# Patient Record
Sex: Male | Born: 1965 | Race: White | Hispanic: No | Smoking: Never smoker
Health system: Southern US, Community
[De-identification: ages and names within clinical notes are randomized; demographics above are authoritative.]

---

## 1998-04-06 ENCOUNTER — Emergency Department (HOSPITAL_COMMUNITY): Admission: EM | Admit: 1998-04-06 | Discharge: 1998-04-06 | Payer: Self-pay | Admitting: Emergency Medicine

## 1998-10-25 ENCOUNTER — Emergency Department (HOSPITAL_COMMUNITY): Admission: EM | Admit: 1998-10-25 | Discharge: 1998-10-25 | Payer: Self-pay

## 2011-01-08 ENCOUNTER — Ambulatory Visit
Admission: RE | Admit: 2011-01-08 | Discharge: 2011-01-08 | Disposition: A | Discharge status: Home / Self Care | Payer: BC Managed Care – PPO | Source: Ambulatory Visit | Attending: Family Medicine | Admitting: Family Medicine

## 2011-01-08 ENCOUNTER — Other Ambulatory Visit: Payer: Self-pay | Admitting: Family Medicine

## 2011-01-08 DIAGNOSIS — R1031 Right lower quadrant pain: Secondary | ICD-10-CM

## 2011-01-08 MED ORDER — IOHEXOL 300 MG/ML  SOLN: 125.0000 mL | Freq: Once | INTRAMUSCULAR | Status: AC | PRN

## 2011-11-05 IMAGING — CT CT ABD-PELV W/ CM
3 of 5 series · 13 of 36 positions shown, 19 images · IV contrast (OMNI 300, WATER & [ID] OMNI 300)
Comparison: None

CLINICAL DATA: Right lower quadrant abdominal pain.

CT ABDOMEN AND PELVIS WITH CONTRAST
TECHNIQUE: Multidetector CT imaging of the abdomen and pelvis was
performed following the standard protocol during bolus
administration of intravenous contrast.
Contrast: 125 ml Hmnipaque-KSS.

[Series 3: routine abdomen · axial · 0.72mm/px · z∈[-371,-46]mm · 7 of 84 slices shown, 12 images]
[im 11/84  soft-tissue]
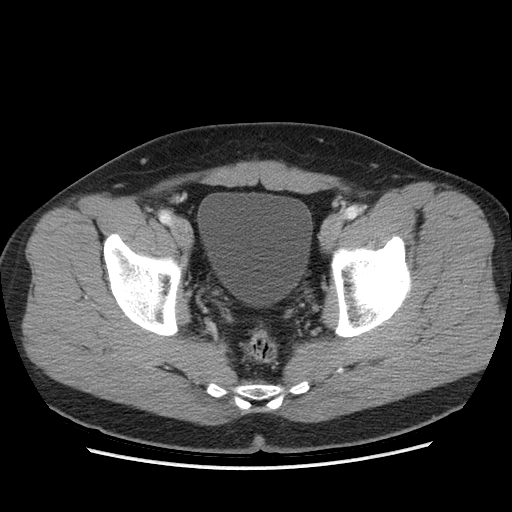
[im 11/84  bone]
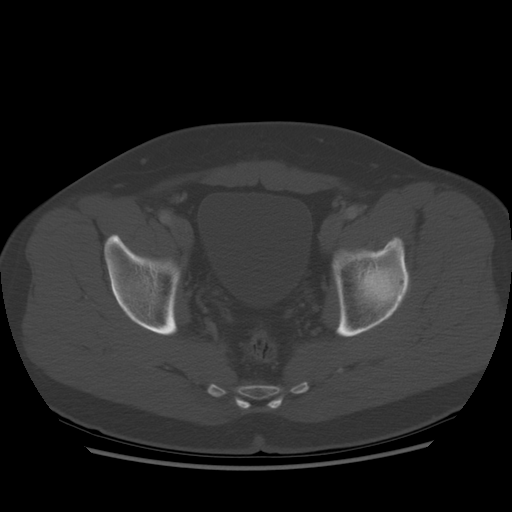
[im 21/84  soft-tissue]
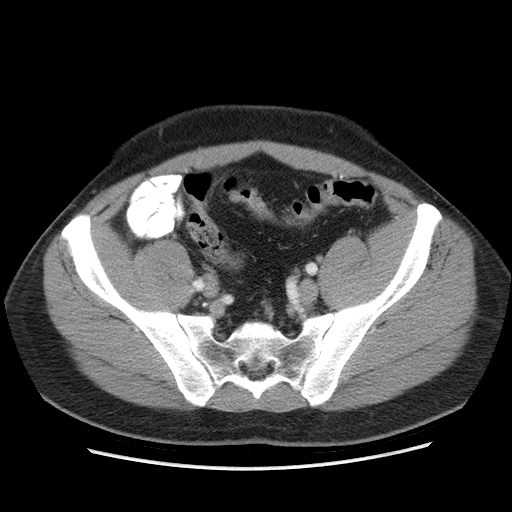
[im 32/84  soft-tissue]
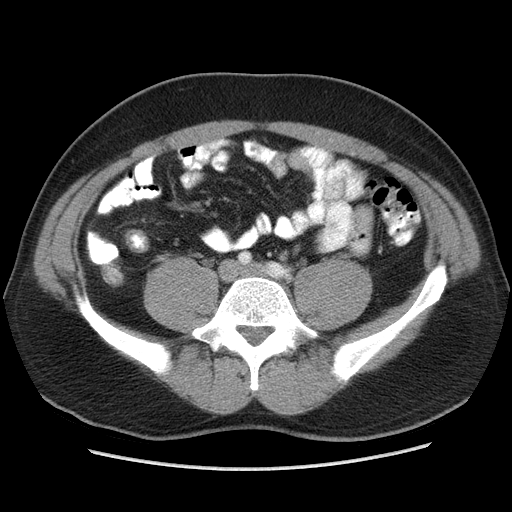
[im 42/84  soft-tissue]
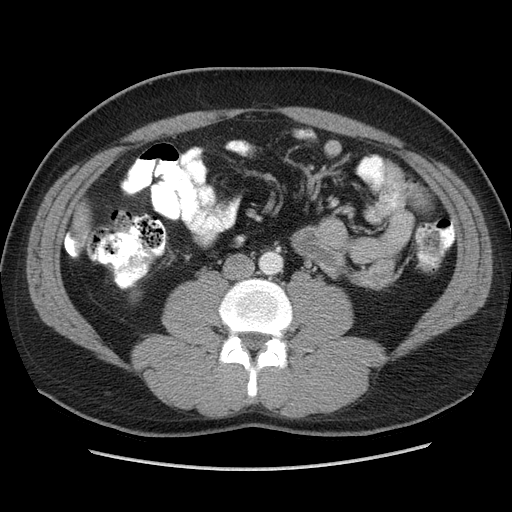
[im 42/84  lung]
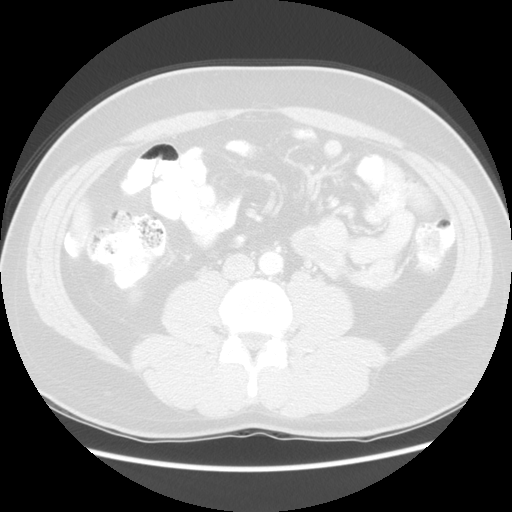
[im 52/84  soft-tissue]
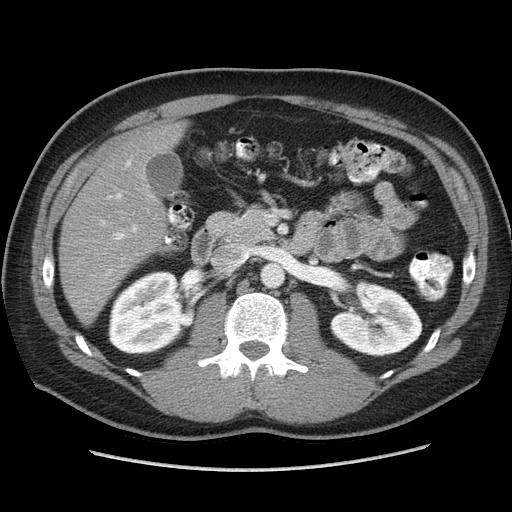
[im 52/84  lung]
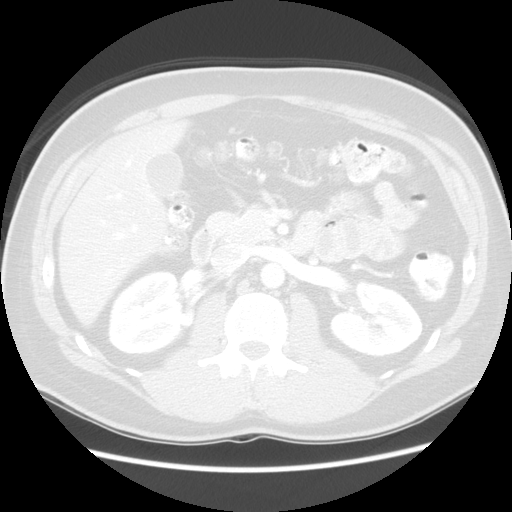
[im 63/84  soft-tissue]
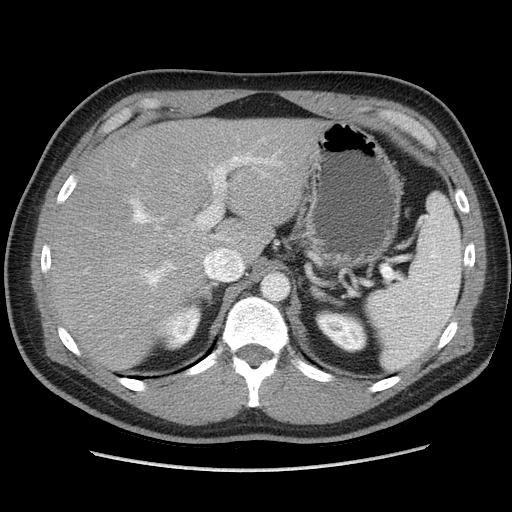
[im 63/84  lung]
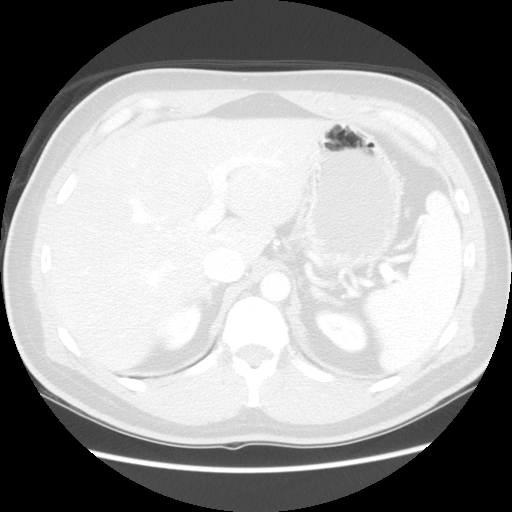
[im 73/84  soft-tissue]
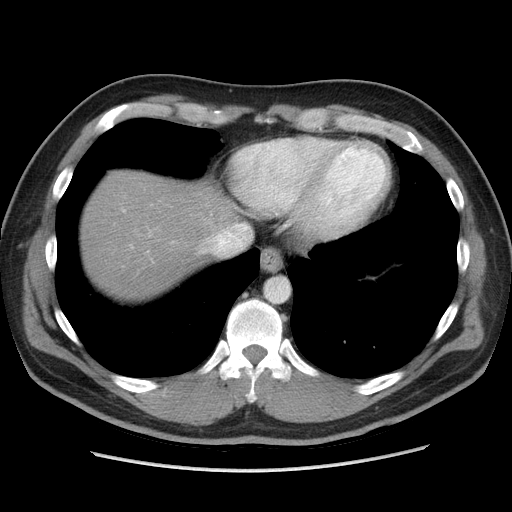
[im 73/84  lung]
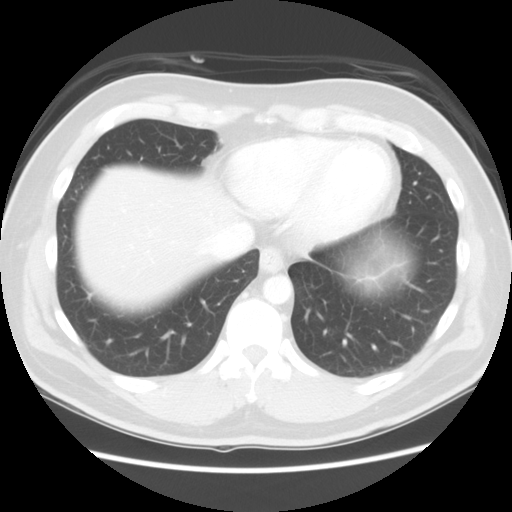

[Series 601: coronal body · coronal · 0.96mm/px · 1 of 115 slices shown, 2 images]
[im 39/115  soft-tissue]
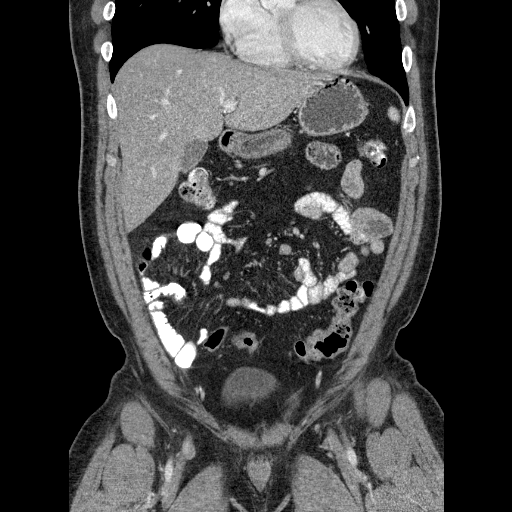
[im 39/115  bone]
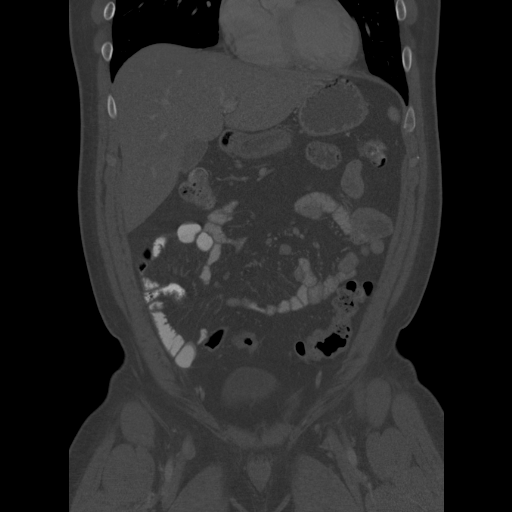

[Series 602: sagittal body · sagittal · 0.96mm/px · 5 of 149 slices shown]
[im 10/149  soft-tissue]
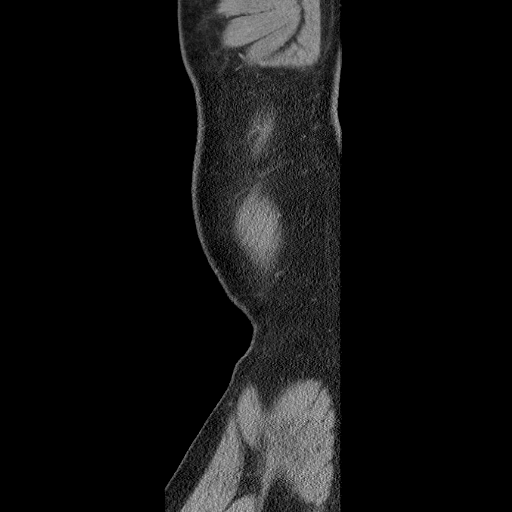
[im 30/149  soft-tissue]
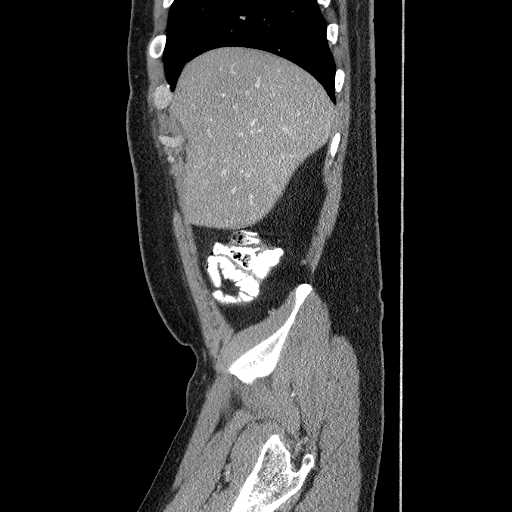
[im 50/149  soft-tissue]
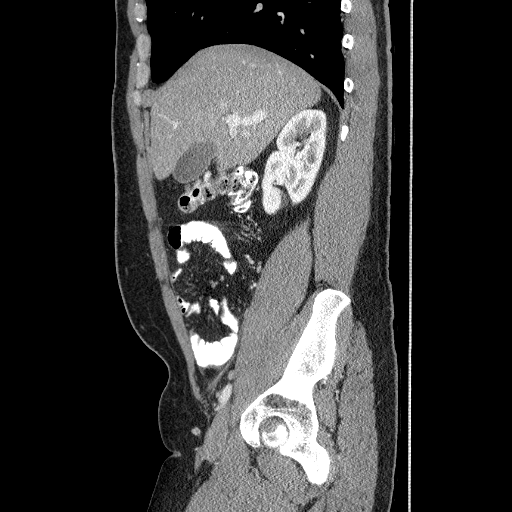
[im 70/149  soft-tissue]
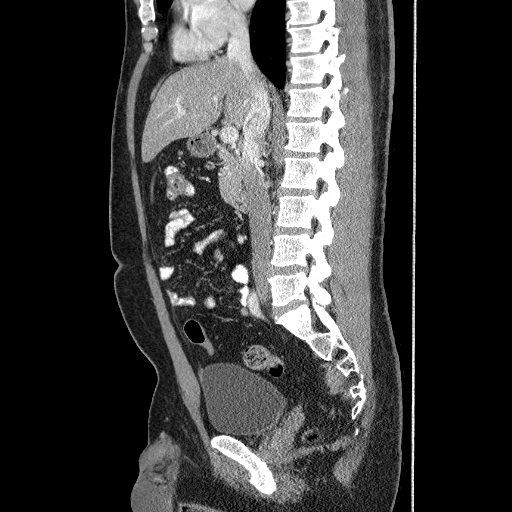
[im 79/149  soft-tissue]
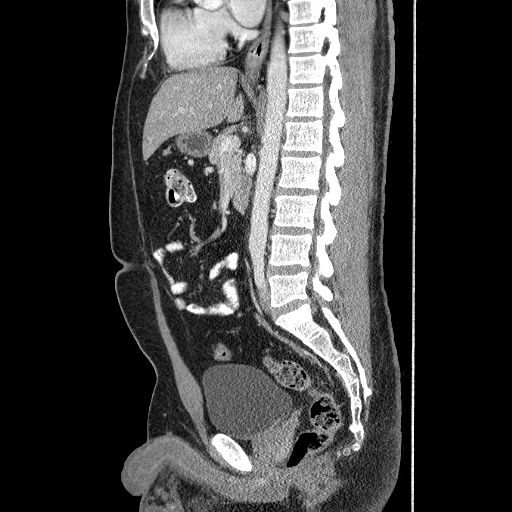

[13 of 36 positions shown; findings below may reference images not displayed]

FINDINGS: The lung bases are clear.

There is diffuse fatty infiltration of the liver but no focal
hepatic lesions or biliary dilatation.  Gallbladder is normal.  No
common bile duct dilatation.  The pancreas is normal.  The spleen
is normal.  The adrenal glands and kidneys are normal except for a
small renal cyst.

The stomach, duodenum, small bowel and colon are unremarkable.

The appendix is distended inflamed and there is peri appendiceal
inflammatory change all consistent with acute appendicitis.  The
terminal ileum is unremarkable.

The bladder, prostate gland and seminal vesicles are normal.  No
pelvic mass or free pelvic fluid collections.  No inguinal mass or
hernia.  No significant bony findings.
IMPRESSION: CT findings consistent with acute appendicitis.

## 2014-03-01 ENCOUNTER — Encounter: Payer: Self-pay | Admitting: Physician Assistant

## 2019-12-02 ENCOUNTER — Emergency Department
Admission: EM | Admit: 2019-12-02 | Discharge: 2019-12-02 | Disposition: A | Discharge status: Home / Self Care | Payer: BC Managed Care – PPO | Source: Home / Self Care | Attending: Family Medicine | Admitting: Family Medicine

## 2019-12-02 ENCOUNTER — Emergency Department (INDEPENDENT_AMBULATORY_CARE_PROVIDER_SITE_OTHER): Payer: BC Managed Care – PPO

## 2019-12-02 ENCOUNTER — Other Ambulatory Visit: Payer: Self-pay

## 2019-12-02 ENCOUNTER — Encounter: Payer: Self-pay | Admitting: Emergency Medicine

## 2019-12-02 DIAGNOSIS — R0781 Pleurodynia: Secondary | ICD-10-CM

## 2019-12-02 DIAGNOSIS — M546 Pain in thoracic spine: Secondary | ICD-10-CM | POA: Diagnosis not present

## 2019-12-02 MED ORDER — CYCLOBENZAPRINE HCL 10 MG PO TABS: 10.0000 mg | ORAL_TABLET | Freq: Every day | ORAL | 1 refills | Status: AC

## 2019-12-02 NOTE — ED Triage Notes (Signed)
Mid Rt Back pain started today

## 2019-12-02 NOTE — ED Provider Notes (Signed)
[facility]    CSN: [id number] Arrival date & time: 12/02/19  1944      History   Chief Complaint Chief Complaint  Patient presents with  . Back Pain    HPI Benjamin Russell is a 54 y.o. male.   Patient noticed onset of non-radiating right mid-back pain today.  The pain is somewhat worse with inspiration.  He denies cough, shortness of breath, fever, fatigue, leg pain/swelling, and rash.  He recalls no injury.   Back Pain Pain location: right mid-back. Quality:  Aching Radiates to:  Does not radiate Pain severity:  Moderate Pain is:  Same all the time Onset quality:  Sudden Duration:  8 hours Timing:  Constant Progression:  Worsening Chronicity:  New Context: lifting heavy objects   Context: not recent illness and not recent injury   Relieved by:  None tried Worsened by:  Movement, coughing and deep breathing Ineffective treatments:  None tried Associated symptoms: no abdominal pain, no abdominal swelling, no fever, no headaches and no paresthesias     History reviewed. No pertinent past medical history.  There are no problems to display for this patient.   History reviewed. No pertinent surgical history.     Home Medications    Prior to Admission medications   Medication Sig Start Date End Date Taking? Authorizing Provider  naproxen sodium (ALEVE) 220 MG tablet Take 220 mg by mouth.   Yes [provider]  cyclobenzaprine (FLEXERIL) 10 MG tablet Take 1 tablet (10 mg total) by mouth at bedtime. 12/02/19   Kandra Nicolas, MD    Family History Family History  Problem Relation Age of Onset  . Healthy Mother   . Healthy Father     Social History Social History   Tobacco Use  . Smoking status: Never Smoker  . Smokeless tobacco: Never Used  Substance Use Topics  . Alcohol use: Not Currently  . Drug use: Not Currently     Allergies   Patient has no known allergies.   Review of Systems Review of Systems  Constitutional:  Negative for activity change, appetite change, chills, diaphoresis, fatigue and fever.  HENT: Negative.   Eyes: Negative.   Respiratory: Negative for cough, shortness of breath, wheezing and stridor.   Cardiovascular: Negative for palpitations and leg swelling.  Gastrointestinal: Negative for abdominal pain.  Genitourinary: Negative.   Musculoskeletal: Positive for back pain.  Skin: Negative for rash.  Neurological: Negative for headaches and paresthesias.     Physical Exam Triage Vital Signs ED Triage Vitals  Enc Vitals Group     BP 12/02/19 1958 (!) 134/94     Pulse Rate 12/02/19 1958 75     Resp --      Temp 12/02/19 1958 99.5 F (37.5 C)     Temp Source 12/02/19 1958 Oral     SpO2 12/02/19 1958 99 %     Weight 12/02/19 2000 220 lb (99.8 kg)     Height 12/02/19 2000 5\' 10"  (1.778 m)     Head Circumference --      Peak Flow --      Pain Score 12/02/19 1959 4     Pain Loc --      Pain Edu? --      Excl. in [place]? --    No data found.  Updated Vital Signs BP (!) 134/94 (BP Location: Right Arm)   Pulse 75   Temp 99.5 F (37.5 C) (Oral)   Ht 5\' 10"  (1.778  m)   Wt 99.8 kg   SpO2 99%   BMI 31.57 kg/m   Visual Acuity Right Eye Distance:   Left Eye Distance:   Bilateral Distance:    Right Eye Near:   Left Eye Near:    Bilateral Near:     Physical Exam Vitals and nursing note reviewed.  Constitutional:      General: He is not in acute distress.    Appearance: He is not ill-appearing.  HENT:     Head: Normocephalic.     Right Ear: External ear normal.     Left Ear: External ear normal.     Nose: Nose normal.     Mouth/Throat:     Pharynx: Oropharynx is clear.  Eyes:     Pupils: Pupils are equal, round, and reactive to light.  Cardiovascular:     Rate and Rhythm: Normal rate.     Heart sounds: Normal heart sounds.  Pulmonary:     Breath sounds: Normal breath sounds.  Abdominal:     Tenderness: There is no abdominal tenderness.  Musculoskeletal:         General: No swelling or tenderness.     Cervical back: No rigidity.     Right lower leg: No edema.     Left lower leg: No edema.  Lymphadenopathy:     Cervical: No cervical adenopathy.  Skin:    General: Skin is warm and dry.     Findings: No rash.          Comments: Tenderness right ribs extending to right lateral and anterior/inferior chest as noted on diagram.  Patient has pain with inspiration.   Neurological:     Mental Status: He is alert.      UC Treatments / Results  Labs (all labs ordered are listed, but only abnormal results are displayed) Labs Reviewed - No data to display  EKG   Radiology DG Ribs Unilateral W/Chest Right  Result Date: 12/02/2019 CLINICAL DATA:  Right rib pain EXAM: RIGHT RIBS AND CHEST - 3+ VIEW COMPARISON:  None. FINDINGS: No fracture or other bone lesions are seen involving the ribs. There is no evidence of pneumothorax or pleural effusion. Both lungs are clear. Heart size and mediastinal contours are within normal limits. IMPRESSION: Negative. Electronically Signed   By: Guadlupe Spanish M.D.   On: 12/02/2019 20:39    Procedures Procedures (including critical care time)  Medications Ordered in UC Medications - No data to display  Initial Impression / Assessment and Plan / UC Course  I have reviewed the triage vital signs and the nursing notes.  Pertinent labs & imaging results that were available during my care of the patient were reviewed by me and considered in my medical decision making (see chart for details).    Negative right rib and chest x-ray reassuring. ?intercostal muscle strain.  ?early herpes zoster. ?radiculopathy Rx for Flexeril 10mg  at bedtime.   Call if rash develops (would start Valtrex). Followup with Family Doctor if not improved in about 2 weeks.   Final Clinical Impressions(s) / UC Diagnoses   Final diagnoses:  Acute right-sided thoracic back pain     Discharge Instructions     May take Ibuprofen 200mg , 4  tabs every 8 hours with food for 3 to 5 days. Apply ice pack for 20 to 30 minutes, 2 to 3 times daily  Continue until pain decreases.     ED Prescriptions    Medication Sig Dispense Auth. Provider  cyclobenzaprine (FLEXERIL) 10 MG tablet Take 1 tablet (10 mg total) by mouth at bedtime. 7 tablet Lattie Haw, MD        Lattie Haw, MD 12/03/19 1228

## 2019-12-02 NOTE — Discharge Instructions (Signed)
May take Ibuprofen 200mg , 4 tabs every 8 hours with food for 3 to 5 days. Apply ice pack for 20 to 30 minutes, 2 to 3 times daily  Continue until pain decreases.

## 2020-04-11 ENCOUNTER — Emergency Department (INDEPENDENT_AMBULATORY_CARE_PROVIDER_SITE_OTHER)
Admission: EM | Admit: 2020-04-11 | Discharge: 2020-04-11 | Disposition: A | Discharge status: Home / Self Care | Payer: BC Managed Care – PPO | Source: Home / Self Care

## 2020-04-11 ENCOUNTER — Other Ambulatory Visit: Payer: Self-pay

## 2020-04-11 DIAGNOSIS — Z9189 Other specified personal risk factors, not elsewhere classified: Secondary | ICD-10-CM

## 2020-04-11 NOTE — ED Triage Notes (Signed)
Pt here today for covid testing. Currently not having any sxs.

## 2020-04-13 LAB — SARS-COV-2, NAA 2 DAY TAT

## 2020-04-13 LAB — NOVEL CORONAVIRUS, NAA: SARS-CoV-2, NAA: NOT DETECTED

## 2020-04-16 ENCOUNTER — Other Ambulatory Visit: Payer: Self-pay

## 2020-04-16 ENCOUNTER — Emergency Department
Admission: EM | Admit: 2020-04-16 | Discharge: 2020-04-16 | Disposition: A | Discharge status: Home / Self Care | Payer: BC Managed Care – PPO | Source: Home / Self Care

## 2020-04-16 DIAGNOSIS — Z20822 Contact with and (suspected) exposure to covid-19: Secondary | ICD-10-CM

## 2020-04-16 NOTE — ED Triage Notes (Signed)
Patient here today for covid testing.  Currently not having any sxs.

## 2020-04-17 LAB — SARS-COV-2 RNA,(COVID-19) QUALITATIVE NAAT: SARS CoV2 RNA: NOT DETECTED

## 2020-09-28 IMAGING — DX DG RIBS W/ CHEST 3+V*R*
3 series · 3 of 3 positions shown · non-contrast
Comparison: None.

CLINICAL DATA: Right rib pain

EXAM:
RIGHT RIBS AND CHEST - 3+ VIEW

[chest pa]
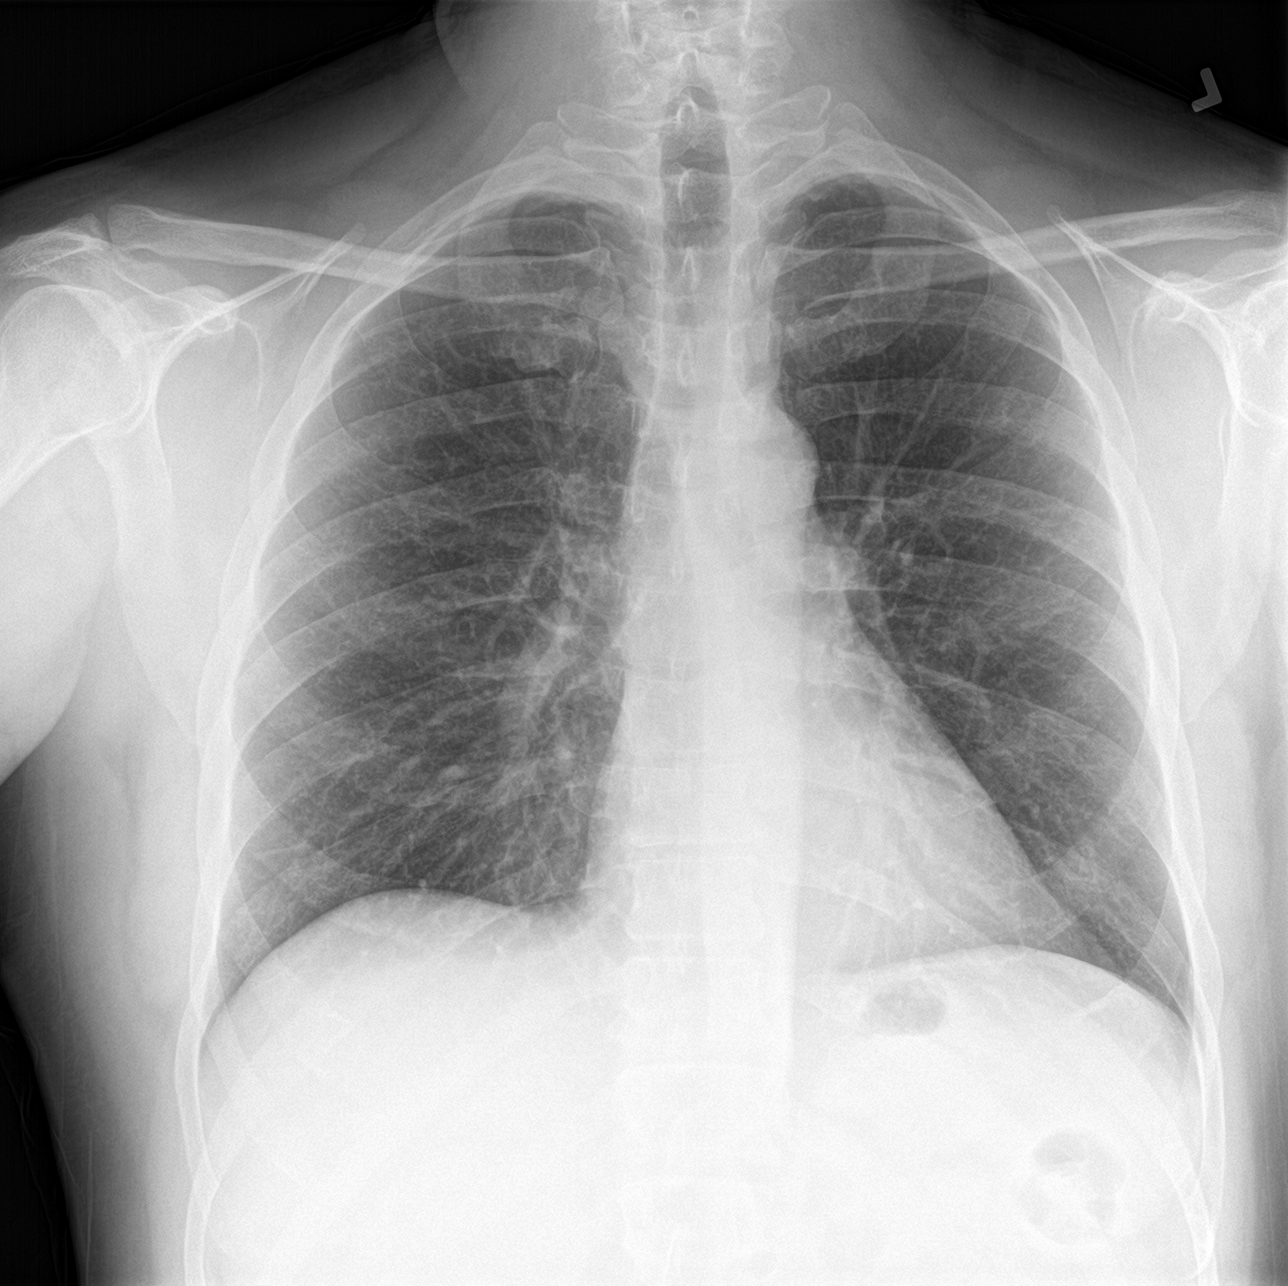

[rib ap]
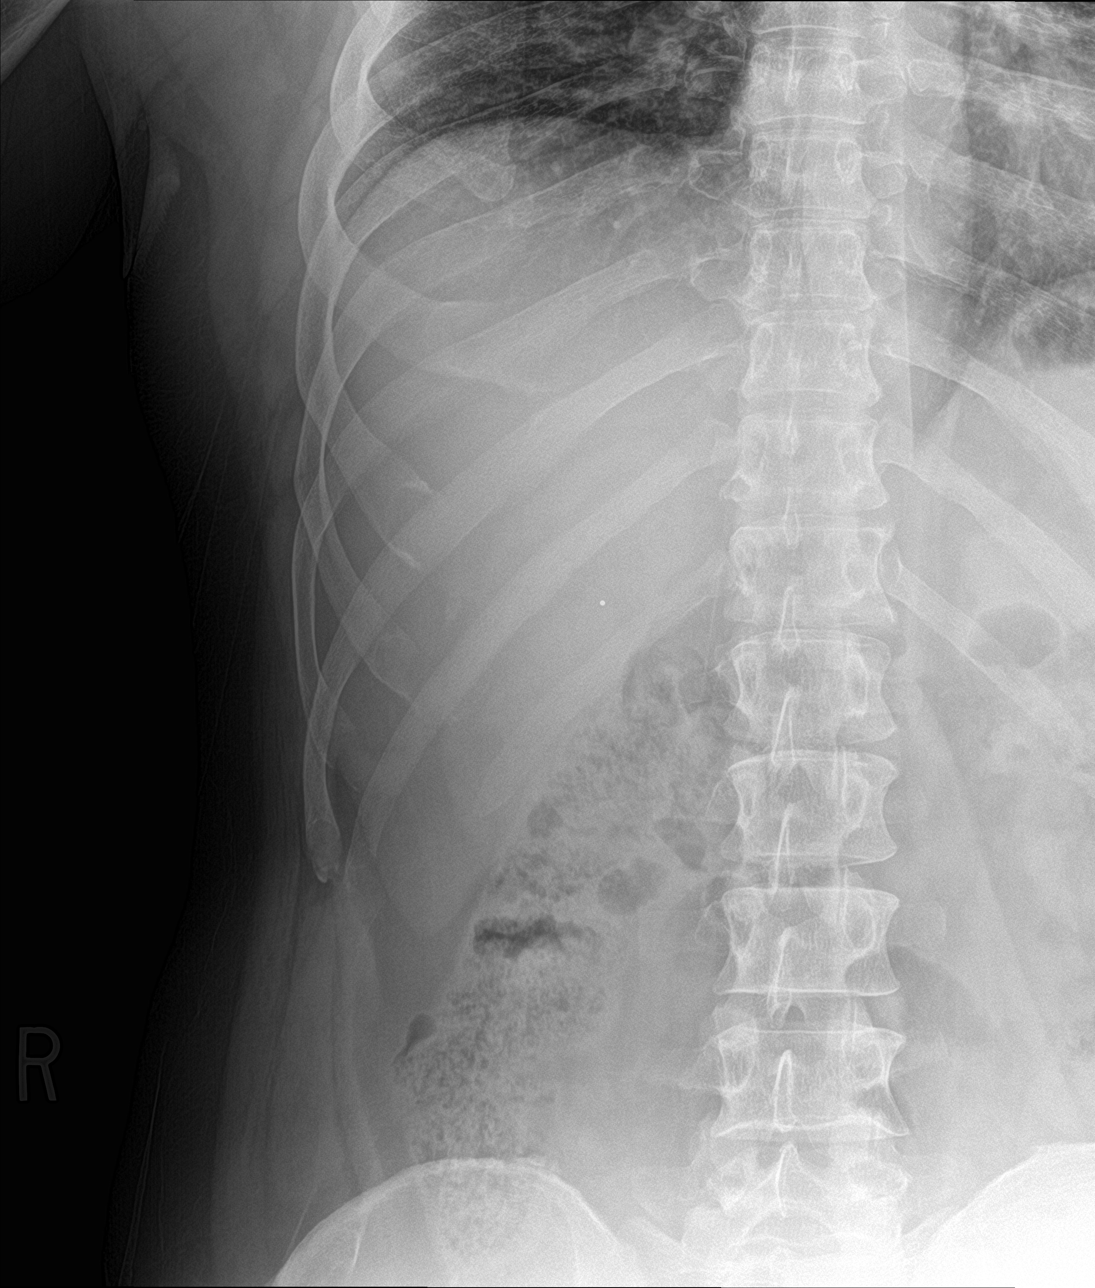

[rib ap obl]
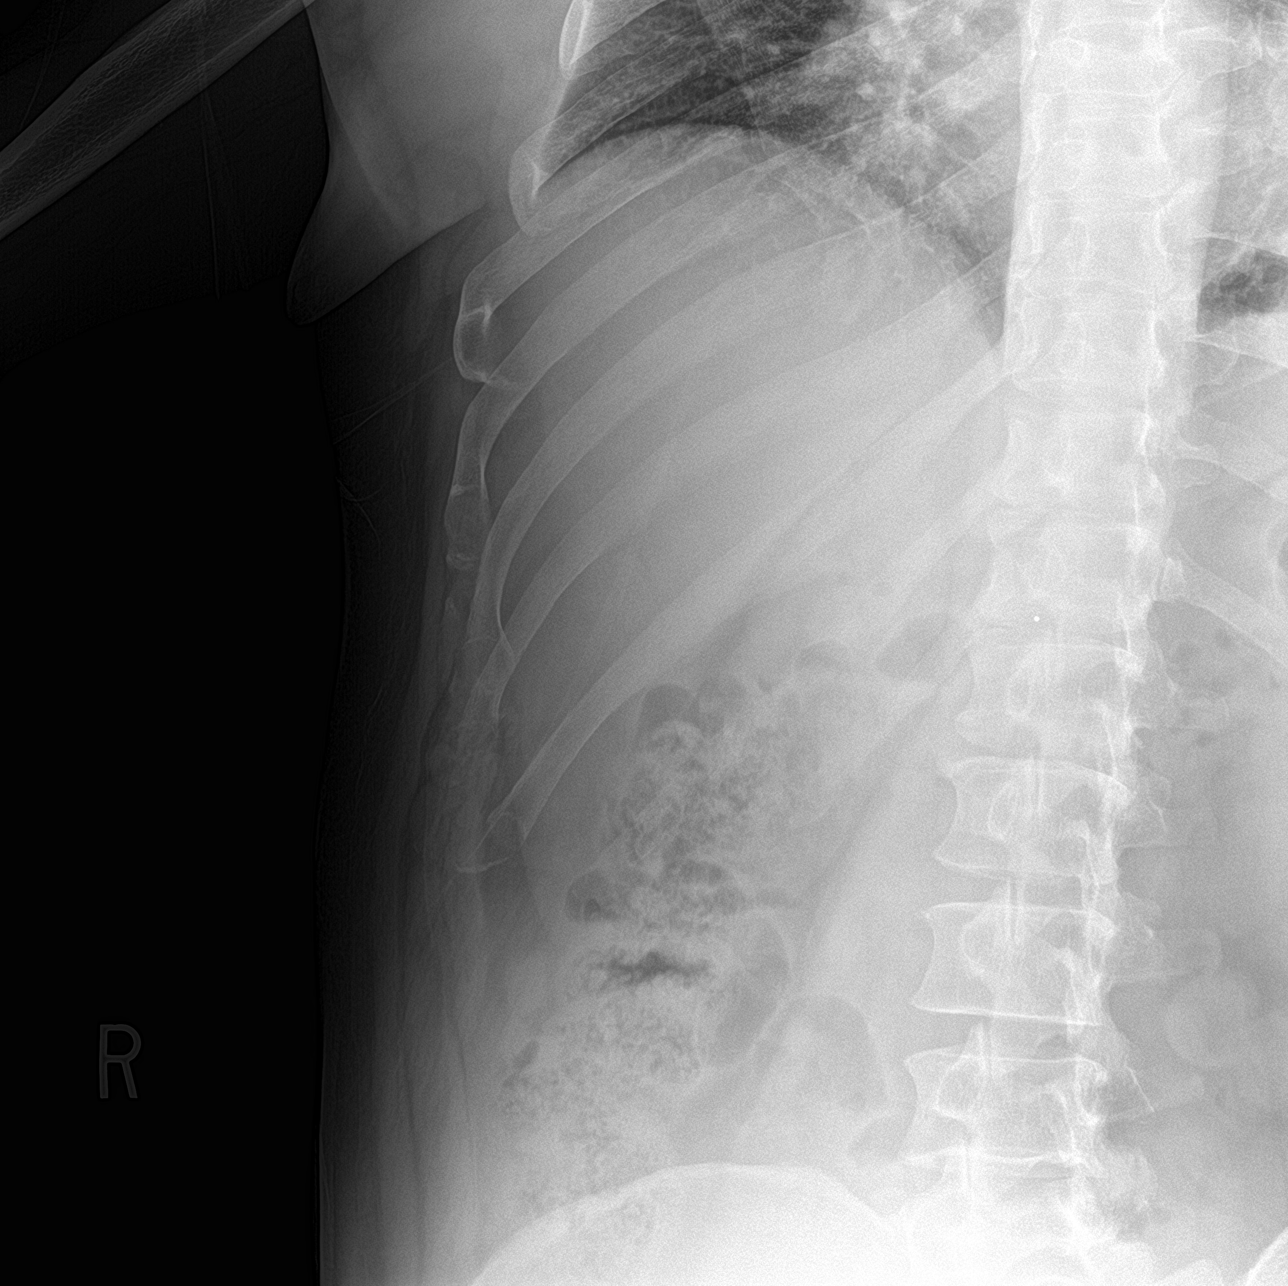

[3 of 3 positions shown; findings below may reference images not displayed]

FINDINGS: No fracture or other bone lesions are seen involving the ribs. There
is no evidence of pneumothorax or pleural effusion. Both lungs are
clear. Heart size and mediastinal contours are within normal limits.
IMPRESSION: Negative.
